# Patient Record
Sex: Female | Born: 1975 | Race: Black or African American | Hispanic: No | Marital: Single | Smoking: Current every day smoker
Health system: Southern US, Community
[De-identification: ages and names within clinical notes are randomized; demographics above are authoritative.]

## PROBLEM LIST (undated history)

## (undated) HISTORY — PX: CHOLECYSTECTOMY: SHX55

---

## 2018-02-03 ENCOUNTER — Encounter: Payer: Self-pay | Admitting: Emergency Medicine

## 2018-02-03 ENCOUNTER — Emergency Department
Admission: EM | Admit: 2018-02-03 | Discharge: 2018-02-03 | Disposition: A | Discharge status: Home / Self Care | Payer: BC Managed Care – PPO | Attending: Emergency Medicine | Admitting: Emergency Medicine

## 2018-02-03 ENCOUNTER — Emergency Department: Payer: BC Managed Care – PPO

## 2018-02-03 DIAGNOSIS — M25532 Pain in left wrist: Secondary | ICD-10-CM | POA: Insufficient documentation

## 2018-02-03 DIAGNOSIS — S4992XA Unspecified injury of left shoulder and upper arm, initial encounter: Secondary | ICD-10-CM | POA: Diagnosis present

## 2018-02-03 DIAGNOSIS — Y92411 Interstate highway as the place of occurrence of the external cause: Secondary | ICD-10-CM | POA: Diagnosis not present

## 2018-02-03 DIAGNOSIS — S46912A Strain of unspecified muscle, fascia and tendon at shoulder and upper arm level, left arm, initial encounter: Secondary | ICD-10-CM | POA: Diagnosis not present

## 2018-02-03 DIAGNOSIS — Z79899 Other long term (current) drug therapy: Secondary | ICD-10-CM | POA: Insufficient documentation

## 2018-02-03 DIAGNOSIS — Y9389 Activity, other specified: Secondary | ICD-10-CM | POA: Diagnosis not present

## 2018-02-03 DIAGNOSIS — M5412 Radiculopathy, cervical region: Secondary | ICD-10-CM | POA: Diagnosis not present

## 2018-02-03 DIAGNOSIS — Y999 Unspecified external cause status: Secondary | ICD-10-CM | POA: Insufficient documentation

## 2018-02-03 MED ORDER — MELOXICAM 15 MG PO TABS: 15.0000 mg | ORAL_TABLET | Freq: Every day | ORAL | 2 refills | Status: AC

## 2018-02-03 MED ORDER — BACLOFEN 10 MG PO TABS: 10.0000 mg | ORAL_TABLET | Freq: Every day | ORAL | 1 refills | Status: AC
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
## 2018-02-03 NOTE — Discharge Instructions (Addendum)
Follow-up with your regular doctor or your orthopedic doctor if you are worsening.  Take medication as prescribed.  Due to you Artie having a nerve impingement if the symptoms in your shoulder and wrist become worse you need to see your doctor.  Return to the emergency department if you are any new problems arise or if you are worsening.  Apply ice to all areas that hurt
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
## 2020-01-14 ENCOUNTER — Encounter: Payer: Self-pay | Admitting: Emergency Medicine

## 2020-01-14 ENCOUNTER — Other Ambulatory Visit: Payer: Self-pay

## 2020-01-14 ENCOUNTER — Emergency Department: Payer: BC Managed Care – PPO

## 2020-01-14 ENCOUNTER — Emergency Department
Admission: EM | Admit: 2020-01-14 | Discharge: 2020-01-14 | Disposition: A | Discharge status: Home / Self Care | Payer: BC Managed Care – PPO | Attending: Emergency Medicine | Admitting: Emergency Medicine

## 2020-01-14 DIAGNOSIS — M5414 Radiculopathy, thoracic region: Secondary | ICD-10-CM | POA: Diagnosis not present

## 2020-01-14 DIAGNOSIS — Z79899 Other long term (current) drug therapy: Secondary | ICD-10-CM | POA: Insufficient documentation

## 2020-01-14 DIAGNOSIS — M5410 Radiculopathy, site unspecified: Secondary | ICD-10-CM

## 2020-01-14 DIAGNOSIS — R109 Unspecified abdominal pain: Secondary | ICD-10-CM

## 2020-01-14 DIAGNOSIS — R1031 Right lower quadrant pain: Secondary | ICD-10-CM | POA: Diagnosis present

## 2020-01-14 DIAGNOSIS — F17200 Nicotine dependence, unspecified, uncomplicated: Secondary | ICD-10-CM | POA: Insufficient documentation

## 2020-01-14 LAB — URINALYSIS, COMPLETE (UACMP) WITH MICROSCOPIC
Bacteria, UA: NONE SEEN
Bilirubin Urine: NEGATIVE
Glucose, UA: NEGATIVE mg/dL
Hgb urine dipstick: NEGATIVE
Ketones, ur: NEGATIVE mg/dL
Leukocytes,Ua: NEGATIVE
Nitrite: NEGATIVE
Protein, ur: NEGATIVE mg/dL
Specific Gravity, Urine: 1.008 (ref 1.005–1.030)
pH: 7 (ref 5.0–8.0)

## 2020-01-14 LAB — CBC WITH DIFFERENTIAL/PLATELET
Abs Immature Granulocytes: 0.04 10*3/uL (ref 0.00–0.07)
Basophils Absolute: 0 10*3/uL (ref 0.0–0.1)
Basophils Relative: 0 %
Eosinophils Absolute: 0.3 10*3/uL (ref 0.0–0.5)
Eosinophils Relative: 4 %
HCT: 38.4 % (ref 36.0–46.0)
Hemoglobin: 11.8 g/dL — ABNORMAL LOW (ref 12.0–15.0)
Immature Granulocytes: 1 %
Lymphocytes Relative: 29 %
Lymphs Abs: 2.3 10*3/uL (ref 0.7–4.0)
MCH: 28.6 pg (ref 26.0–34.0)
MCHC: 30.7 g/dL (ref 30.0–36.0)
MCV: 93.2 fL (ref 80.0–100.0)
Monocytes Absolute: 0.5 10*3/uL (ref 0.1–1.0)
Monocytes Relative: 6 %
Neutro Abs: 4.8 10*3/uL (ref 1.7–7.7)
Neutrophils Relative %: 60 %
Platelets: 339 10*3/uL (ref 150–400)
RBC: 4.12 MIL/uL (ref 3.87–5.11)
RDW: 14 % (ref 11.5–15.5)
WBC: 8 10*3/uL (ref 4.0–10.5)
nRBC: 0 % (ref 0.0–0.2)

## 2020-01-14 LAB — COMPREHENSIVE METABOLIC PANEL
ALT: 15 U/L (ref 0–44)
AST: 17 U/L (ref 15–41)
Albumin: 3.7 g/dL (ref 3.5–5.0)
Alkaline Phosphatase: 58 U/L (ref 38–126)
Anion gap: 7 (ref 5–15)
BUN: 11 mg/dL (ref 6–20)
CO2: 27 mmol/L (ref 22–32)
Calcium: 8.7 mg/dL — ABNORMAL LOW (ref 8.9–10.3)
Chloride: 105 mmol/L (ref 98–111)
Creatinine, Ser: 0.57 mg/dL (ref 0.44–1.00)
GFR calc Af Amer: >60 mL/min (ref >60)
GFR calc non Af Amer: >60 mL/min (ref >60)
Glucose, Bld: 101 mg/dL — ABNORMAL HIGH (ref 70–99)
Potassium: 3.6 mmol/L (ref 3.5–5.1)
Sodium: 139 mmol/L (ref 135–145)
Total Bilirubin: 0.5 mg/dL (ref 0.3–1.2)
Total Protein: 7.6 g/dL (ref 6.5–8.1)

## 2020-01-14 LAB — HCG, QUANTITATIVE, PREGNANCY: hCG, Beta Chain, Quant, S: 1 m[IU]/mL (ref <5)

## 2020-01-14 LAB — LIPASE, BLOOD: Lipase: 22 U/L (ref 11–51)

## 2020-01-14 MED ORDER — FENTANYL CITRATE (PF) 100 MCG/2ML IJ SOLN
50.0000 ug | Freq: Once | INTRAMUSCULAR | Status: AC
  Administered 2020-01-14: 50 ug via INTRAVENOUS
  Filled 2020-01-14: qty 2

## 2020-01-14 MED ORDER — HYDROMORPHONE HCL 1 MG/ML IJ SOLN
1.0000 mg | Freq: Once | INTRAMUSCULAR | Status: AC
  Administered 2020-01-14: 08:00:00 1 mg via INTRAVENOUS
  Filled 2020-01-14: qty 1

## 2020-01-14 MED ORDER — NAPROXEN 375 MG PO TABS: 375.0000 mg | ORAL_TABLET | Freq: Two times a day (BID) | ORAL | 0 refills | Status: AC

## 2020-01-14 MED ORDER — KETOROLAC TROMETHAMINE 30 MG/ML IJ SOLN
15.0000 mg | Freq: Once | INTRAMUSCULAR | Status: AC
  Administered 2020-01-14: 08:00:00 15 mg via INTRAVENOUS
  Filled 2020-01-14: qty 1

## 2020-01-14 MED ORDER — HYDROCODONE-ACETAMINOPHEN 5-325 MG PO TABS: 1.0000 | ORAL_TABLET | ORAL | 0 refills | Status: AC | PRN

## 2020-01-14 MED ORDER — IOHEXOL 350 MG/ML SOLN
100.0000 mL | Freq: Once | INTRAVENOUS | Status: AC | PRN
  Administered 2020-01-14: 100 mL via INTRAVENOUS

## 2020-01-14 MED ORDER — ONDANSETRON HCL 4 MG/2ML IJ SOLN
4.0000 mg | Freq: Once | INTRAMUSCULAR | Status: AC
  Administered 2020-01-14: 4 mg via INTRAVENOUS
  Filled 2020-01-14: qty 2

## 2020-01-14 NOTE — ED Triage Notes (Signed)
Pt to triage via w/c, tearful; pt reports x 4 days having rt flank/side pain with no accomp symptoms, no hx of same

## 2020-01-14 NOTE — ED Notes (Signed)
Pt still c/o pain. Pt tearful when this RN walks in. MD notified.

## 2020-01-14 NOTE — Discharge Instructions (Addendum)
As we discussed, I suspect your symptoms could be due to a herniated disc or muscular strain.  Start taking the naproxen, which is similar to Aleve, twice a day for the next 5 days.  Take this with food.  In addition, I have prescribed a stronger pain medication as needed.  Do not drive while taking the hydrocodone.

## 2020-01-14 NOTE — ED Provider Notes (Signed)
Assumed care from Dr. Don Perking at 7 AM. Briefly, the patient is a 44 y.o. female with PMHx of  has no past medical history on file. here with severe R flank pain radiating towards RLQ. CT stone study is negative. CBC unremarkable with exception of mild anemia. CMP shows normal LFTs, renal function. She is s/p cholecystectomy. Plan to f/u UA.   Labs Reviewed  CBC WITH DIFFERENTIAL/PLATELET - Abnormal; Notable for the following components:      Result Value   Hemoglobin 11.8 (*)    All other components within normal limits  COMPREHENSIVE METABOLIC PANEL - Abnormal; Notable for the following components:   Glucose, Bld 101 (*)    Calcium 8.7 (*)    All other components within normal limits  HCG, QUANTITATIVE, PREGNANCY  LIPASE, BLOOD  URINALYSIS, COMPLETE (UACMP) WITH MICROSCOPIC   Course of Care: -Had a long discussion with patient regarding D-dimer versus CT angio.  Given her symptoms are so severe, she elects for CT which I think is reasonable as it will also show me musculoskeletal etiology and lung parenchyma.  Fortunately, CT negative.  Of note, patient symptoms do seem somewhat positional and she has a history of radicular symptoms in her neck.  I suspect she likely has a component of musculoskeletal chest wall pain and possibly thoracic radiculopathy.  Will treat with NSAIDs and brief course of analgesics.  No apparent emergent pathology.  Return precautions given.  She feels markedly improved in the ED.     Shaune Pollack, MD 01/14/20 [phone]

## 2020-01-14 NOTE — ED Provider Notes (Signed)
[facility] Emergency Department Provider Note  ____________________________________________  Time seen: Approximately 6:10 AM  I have reviewed the triage vital signs and the nursing notes.   HISTORY  Chief Complaint Flank Pain   HPI Sandra Mahoney is a 44 y.o. female with a history of elevated BMI and cholecystectomy who presents for evaluation of right flank pain.  Patient has had  sharp constant severe pain for 4 days.  She did not come before to the emergency room because she had to work.  The pain radiates to her lower abdomen and to her upper back.  She denies ever having similar pain.  No dysuria or hematuria, no fever chills, no chest pain, no diarrhea or constipation, no nausea or vomiting.  She does report that the pain is worse with deep inspiration or when she coughs.  She reports history of chronic cough from smoking.  Denies any personal or family history of blood clots, recent travel immobilization, leg pain, hemoptysis, exogenous hormones.  PMH  Elevated BMI  Past Surgical History:  Procedure Laterality Date  . CHOLECYSTECTOMY      Prior to Admission medications   Medication Sig Start Date End Date Taking? Authorizing Provider  buPROPion (WELLBUTRIN SR) 150 MG 12 hr tablet Take 150 mg by mouth 2 (two) times daily.    [provider]  gabapentin (NEURONTIN) 300 MG capsule Take 300 mg by mouth at bedtime.    [provider]  HYDROcodone-acetaminophen (NORCO/VICODIN) 5-325 MG tablet Take 1-2 tablets by mouth every 4 (four) hours as needed for moderate pain or severe pain. 01/14/20 01/13/21  Duffy Bruce, MD  naproxen (NAPROSYN) 375 MG tablet Take 1 tablet (375 mg total) by mouth 2 (two) times daily with a meal for 5 days. 01/14/20 01/19/20  Duffy Bruce, MD    Allergies Percocet [oxycodone-acetaminophen]  No family history on file.  Social History Social History   Tobacco Use  . Smoking status: Current Every Day  Smoker  . Smokeless tobacco: Never Used  Substance Use Topics  . Alcohol use: Not on file  . Drug use: Not on file    Review of Systems  Constitutional: Negative for fever. Eyes: Negative for visual changes. ENT: Negative for sore throat. Neck: No neck pain  Cardiovascular: Negative for chest pain. Respiratory: Negative for shortness of breath. Gastrointestinal: Negative for abdominal pain, vomiting or diarrhea. Genitourinary: Negative for dysuria. + R flank pain Musculoskeletal: Negative for back pain. Skin: Negative for rash. Neurological: Negative for headaches, weakness or numbness. Psych: No SI or HI  ____________________________________________   PHYSICAL EXAM:  VITAL SIGNS: ED Triage Vitals  Enc Vitals Group     BP 01/14/20 0549 131/76     Pulse Rate 01/14/20 0549 93     Resp 01/14/20 0549 20     Temp 01/14/20 0549 97.9 F (36.6 C)     Temp Source 01/14/20 0549 Oral     SpO2 01/14/20 0549 97 %     Weight 01/14/20 0548 (!) 350 lb (158.8 kg)     Height 01/14/20 0548 5\' 5"  (1.651 m)     Head Circumference --      Peak Flow --      Pain Score 01/14/20 0547 10     Pain Loc --      Pain Edu? --      Excl. in [place]? --     Constitutional: Alert and oriented. Well appearing and in no apparent distress. HEENT:  Head: Normocephalic and atraumatic.         Eyes: Conjunctivae are normal. Sclera is non-icteric.       Mouth/Throat: Mucous membranes are moist.       Neck: Supple with no signs of meningismus. Cardiovascular: Regular rate and rhythm. No murmurs, gallops, or rubs. 2+ symmetrical distal pulses are present in all extremities. No JVD. Respiratory: Normal respiratory effort. Lungs are clear to auscultation bilaterally. No wheezes, crackles, or rhonchi.  Gastrointestinal: Limited exam due to body habitus, non tender, and non distended with positive bowel sounds. No rebound or guarding Genitourinary: Mild R CVA tenderness. Musculoskeletal: Nontender with  normal range of motion in all extremities. No edema, cyanosis, or erythema of extremities. Neurologic: Normal speech and language. Face is symmetric. Moving all extremities. No gross focal neurologic deficits are appreciated. Skin: Skin is warm, dry and intact. No rash noted. Psychiatric: Mood and affect are normal. Speech and behavior are normal.  ____________________________________________   LABS (all labs ordered are listed, but only abnormal results are displayed)  Labs Reviewed  CBC WITH DIFFERENTIAL/PLATELET - Abnormal; Notable for the following components:      Result Value   Hemoglobin 11.8 (*)    All other components within normal limits  COMPREHENSIVE METABOLIC PANEL - Abnormal; Notable for the following components:   Glucose, Bld 101 (*)    Calcium 8.7 (*)    All other components within normal limits  URINALYSIS, COMPLETE (UACMP) WITH MICROSCOPIC - Abnormal; Notable for the following components:   Color, Urine YELLOW (*)    APPearance CLEAR (*)    All other components within normal limits  HCG, QUANTITATIVE, PREGNANCY  LIPASE, BLOOD   ____________________________________________  EKG  none ____________________________________________  RADIOLOGY  CT: pending  ____________________________________________   PROCEDURES  Procedure(s) performed: None Procedures Critical Care performed:  None ____________________________________________   INITIAL IMPRESSION / ASSESSMENT AND PLAN / ED COURSE  44 y.o. female with a history of elevated BMI and cholecystectomy who presents for evaluation of right flank pain x 4 days.  Patient looks uncomfortable and teary-eyed but in no significant distress, she has mild CVA tenderness, abdominal exam is limited by body habitus but no reproducible tenderness.  No midline CT no spine tenderness.  Vital signs are within normal limits.  Old medical records reviewed.  Differential diagnoses including kidney stone versus  pyelonephritis versus shingles although no rashes seen at this time, versus diverticulitis, versus pneumonia although no fever or cough.  Less likely PE with no tachypnea, tachycardia, hypoxia and PERC negative.  Will give IV fentanyl and zofran for pain control. Get CT renal, labs, UA.  _________________________ 7:11 AM on 01/14/2020 ----------------------------------------- Imaging, labs, and UA pending. Care transferred to Dr. Erma Heritage       _____________________________________________ Please note:  Patient was evaluated in Emergency Department today for the symptoms described in the history of present illness. Patient was evaluated in the context of the global COVID-19 pandemic, which necessitated consideration that the patient might be at risk for infection with the SARS-CoV-2 virus that causes COVID-19. Institutional protocols and algorithms that pertain to the evaluation of patients at risk for COVID-19 are in a state of rapid change based on information released by regulatory bodies including the CDC and federal and state organizations. These policies and algorithms were followed during the patient's care in the ED.  Some ED evaluations and interventions may be delayed as a result of limited staffing during the pandemic.   [place] Controlled Substance Database was reviewed by  me. ____________________________________________   FINAL CLINICAL IMPRESSION(S) / ED DIAGNOSES   Final diagnoses:  Right flank pain  Radicular pain      NEW MEDICATIONS STARTED DURING THIS VISIT:  ED Discharge Orders         Ordered    naproxen (NAPROSYN) 375 MG tablet  2 times daily with meals     01/14/20 0937    HYDROcodone-acetaminophen (NORCO/VICODIN) 5-325 MG tablet  Every 4 hours PRN     01/14/20 0086           Note:  This document was prepared using Dragon voice recognition software and may include unintentional dictation errors.    Don Perking, [place], MD 01/15/20 [phone]

## 2021-08-16 IMAGING — CT CT ANGIO CHEST
2 of 6 series · 18 of 46 positions shown · IV contrast (omnipaque)
Comparison: None.

CLINICAL DATA: Right flank pain for 4 days.

EXAM:
CT ANGIOGRAPHY CHEST WITH CONTRAST
TECHNIQUE: Multidetector CT imaging of the chest was performed using the
standard protocol during bolus administration of intravenous
contrast. Multiplanar CT image reconstructions and MIPs were
obtained to evaluate the vascular anatomy.
CONTRAST:  100 mL OMNIPAQUE IOHEXOL 350 MG/ML SOLN

[Series 5: thins · axial · 0.66mm/px · z∈[-564,-312]mm · 15 of 278 slices shown]
[im 13/278  lung]
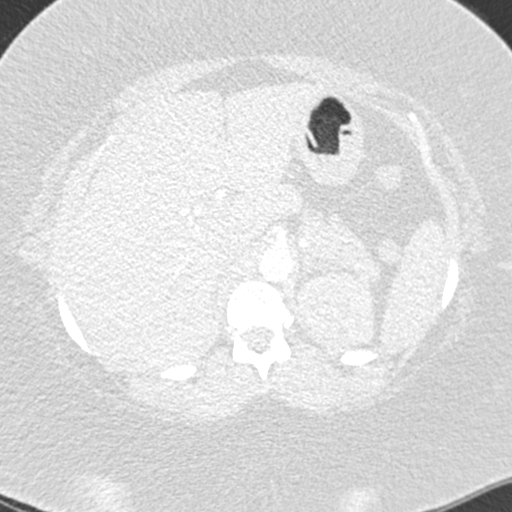
[im 37/278  soft-tissue]
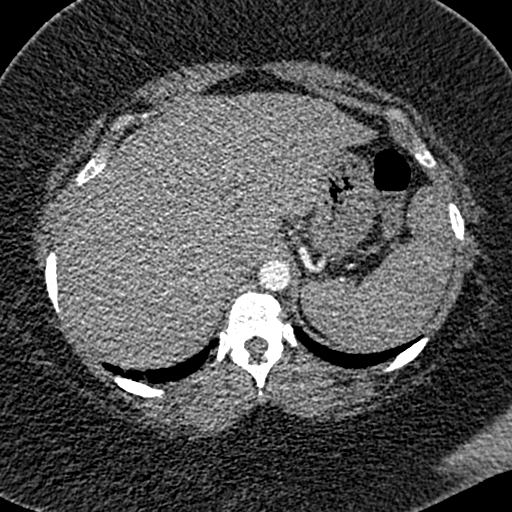
[im 49/278  lung]
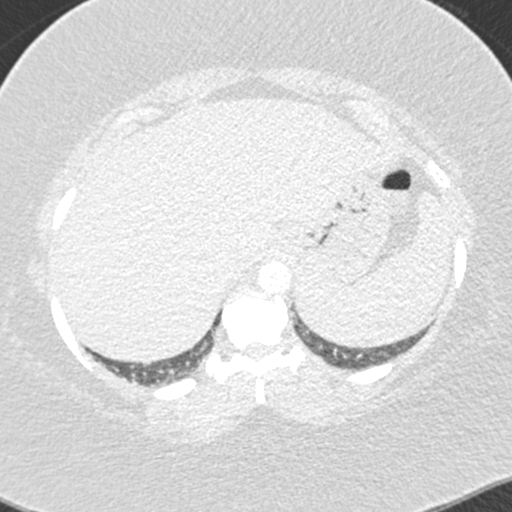
[im 73/278  soft-tissue]
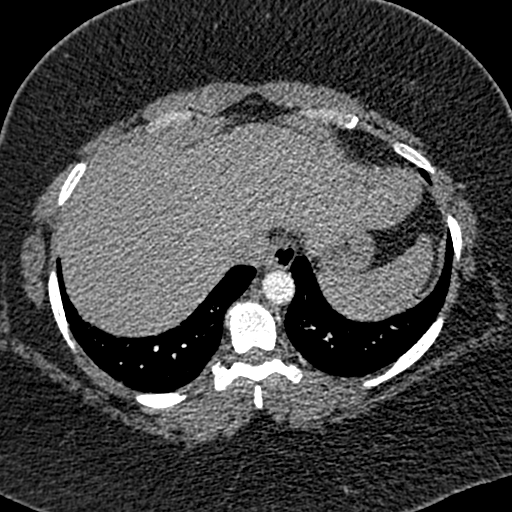
[im 85/278  lung]
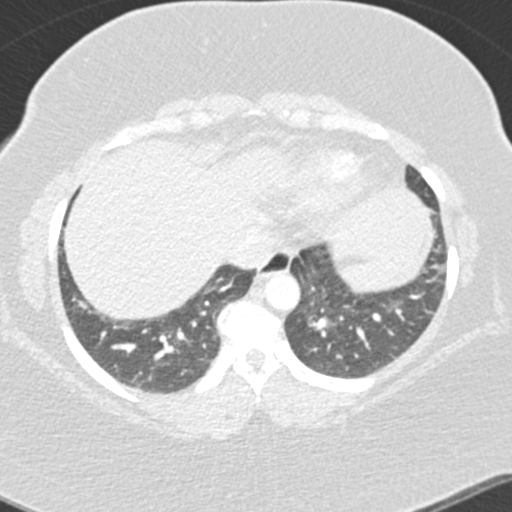
[im 109/278  soft-tissue]
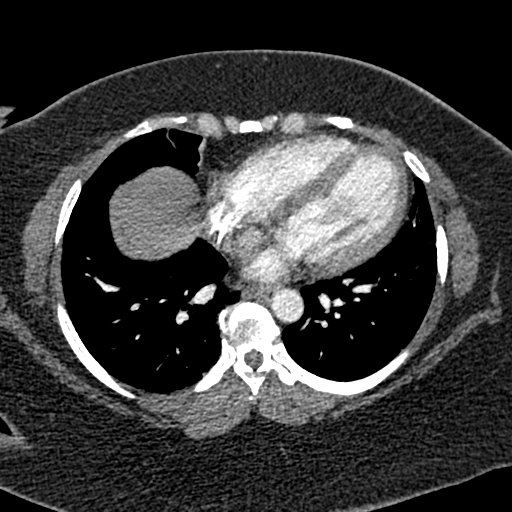
[im 121/278  lung]
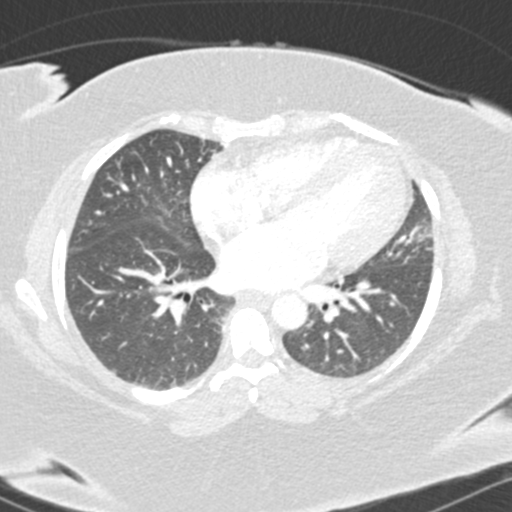
[im 145/278  soft-tissue]
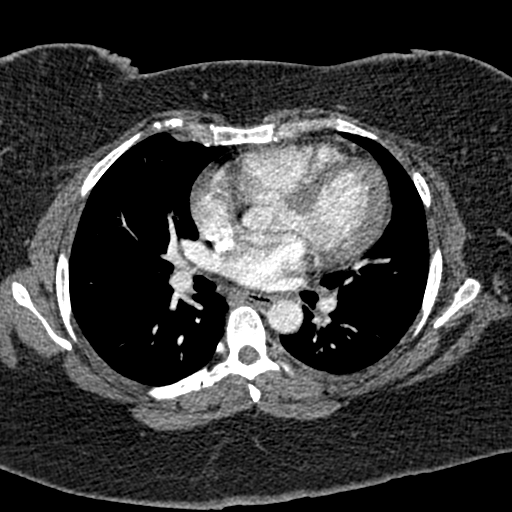
[im 157/278  lung]
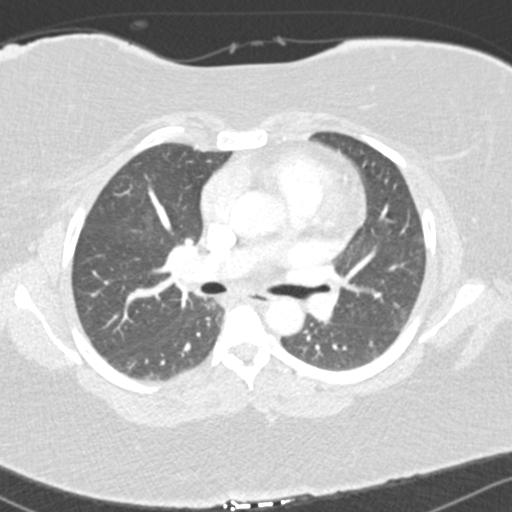
[im 169/278  soft-tissue]
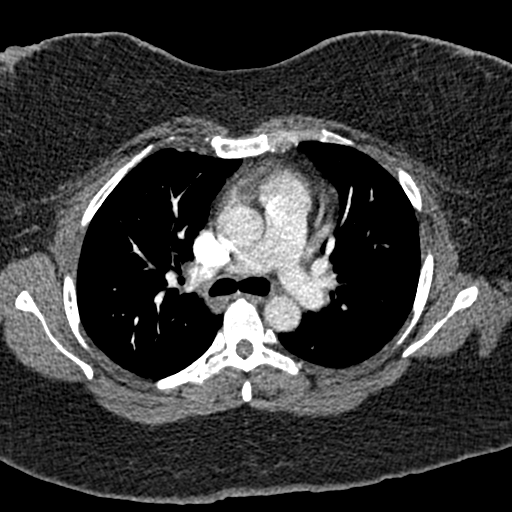
[im 193/278  lung]
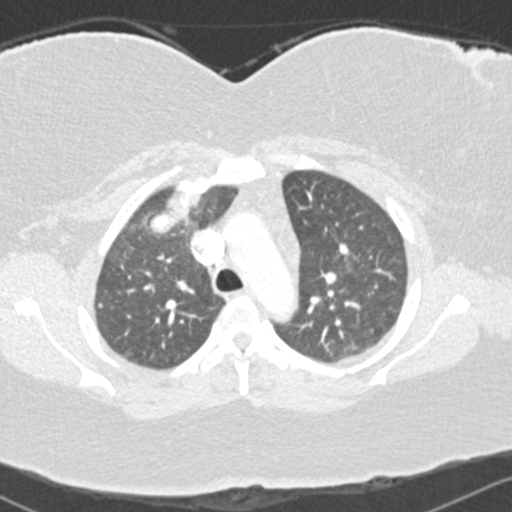
[im 205/278  soft-tissue]
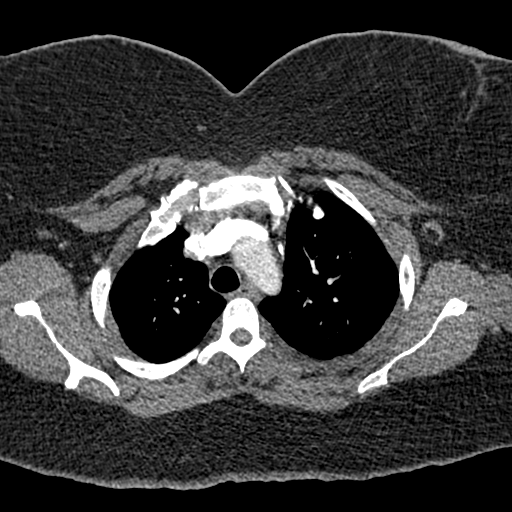
[im 229/278  lung]
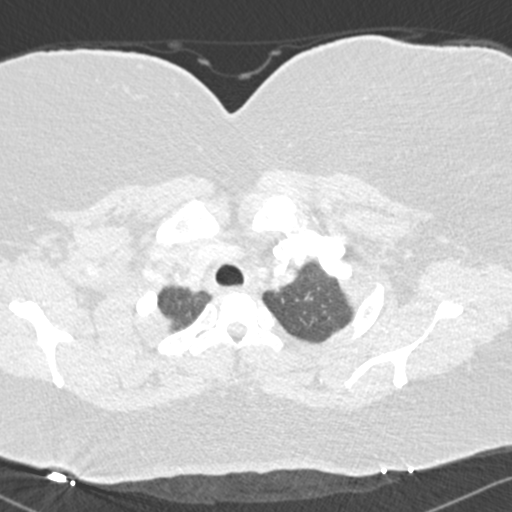
[im 241/278  soft-tissue]
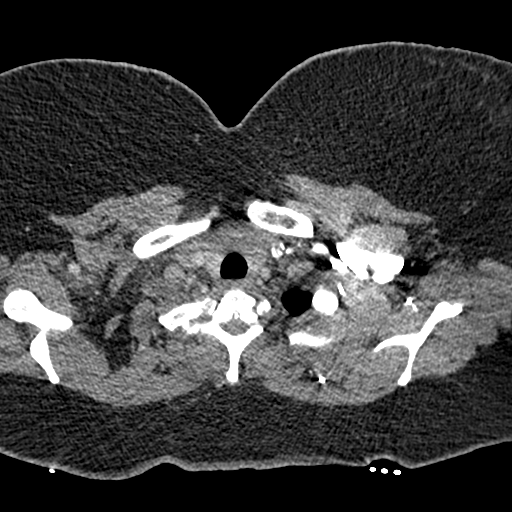
[im 265/278  lung]
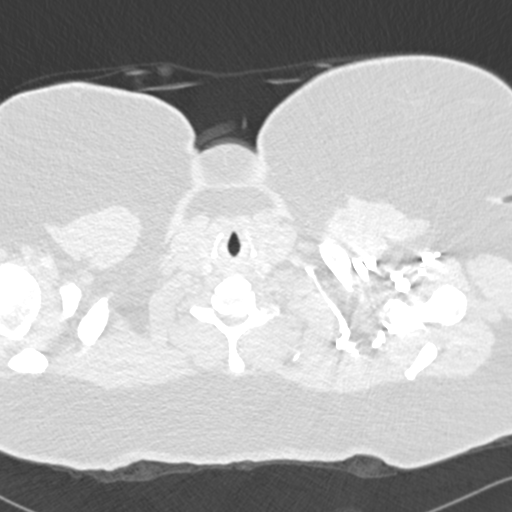

[Series 7: coronal mpr · coronal · 0.57mm/px · 3 of 103 slices shown]
[im 26/103  soft-tissue]
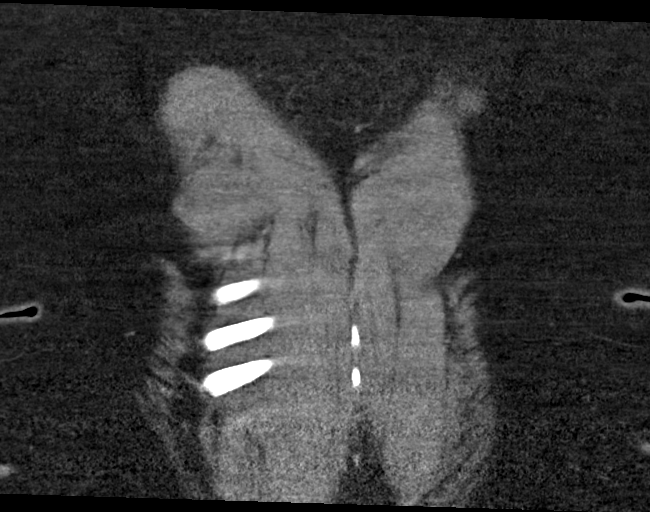
[im 52/103  soft-tissue]
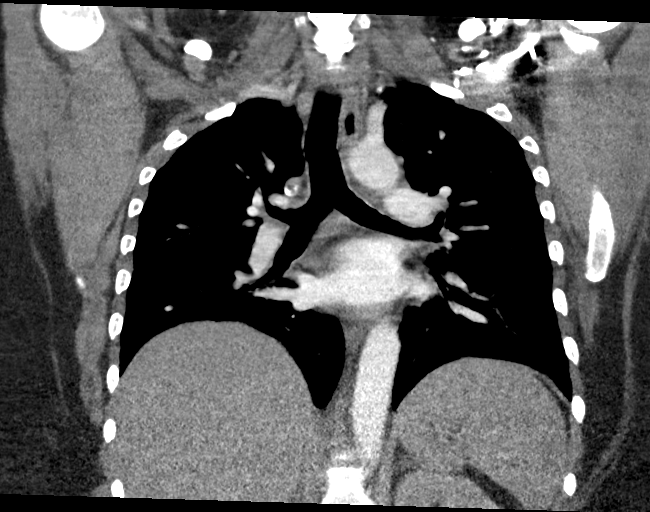
[im 77/103  soft-tissue]
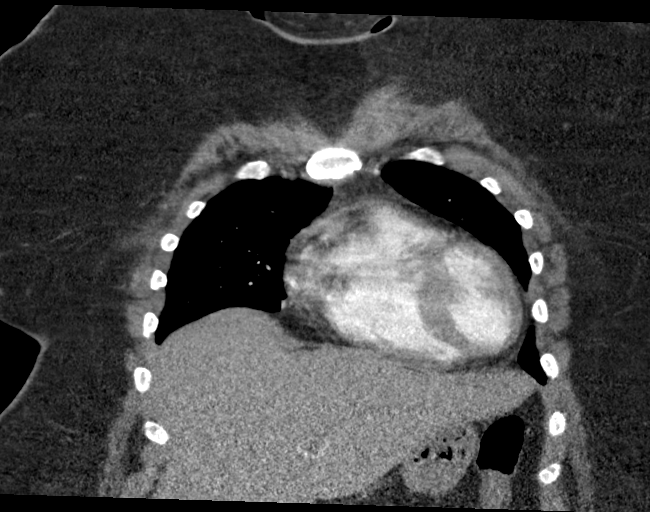

[18 of 46 positions shown; findings below may reference images not displayed]

FINDINGS: Cardiovascular: Satisfactory opacification of the pulmonary arteries
to the segmental level. No evidence of pulmonary embolism. Mild
cardiomegaly. No pericardial effusion.

Mediastinum/Nodes: No enlarged mediastinal, hilar, or axillary lymph
nodes. Thyroid gland, trachea, and esophagus demonstrate no
significant findings.

Lungs/Pleura: Lungs are clear. No pleural effusion or pneumothorax.

Upper Abdomen: No acute finding.  Status post cholecystectomy.

Musculoskeletal: No acute or focal abnormality. Mild thoracic
spondylosis noted.

Review of the MIP images confirms the above findings.
IMPRESSION: Negative for pulmonary embolus.  No acute disease.

Cardiomegaly.

## 2021-08-16 IMAGING — CT CT RENAL STONE PROTOCOL
2 of 4 series · 16 of 46 positions shown, 18 images · non-contrast
Comparison: None.

CLINICAL DATA: Four-day history of right flank pain.

EXAM:
CT ABDOMEN AND PELVIS WITHOUT CONTRAST
TECHNIQUE: Multidetector CT imaging of the abdomen and pelvis was performed
following the standard protocol without IV contrast.

[Series 2: stone full standard · axial · 0.72mm/px · z∈[-940,-464]mm · 13 of 105 slices shown, 15 images]
[im 5/105  soft-tissue]
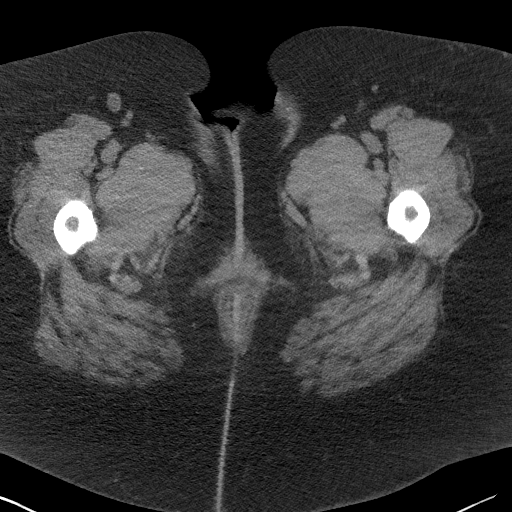
[im 5/105  bone]
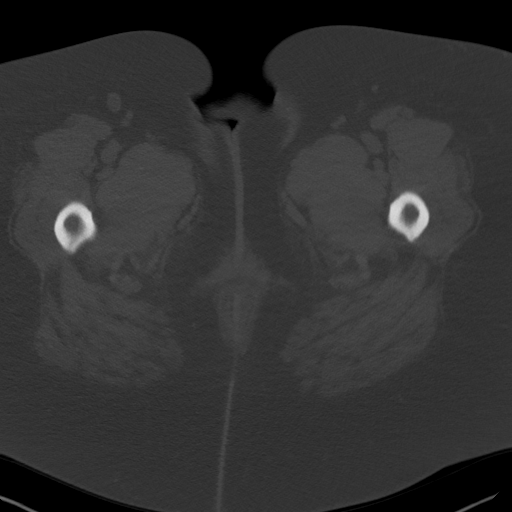
[im 14/105  soft-tissue]
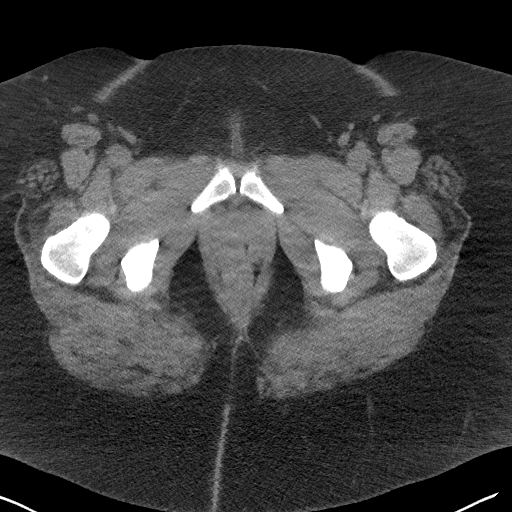
[im 22/105  soft-tissue]
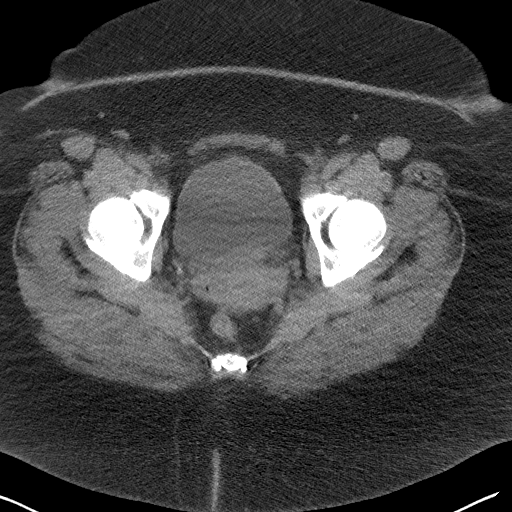
[im 31/105  soft-tissue]
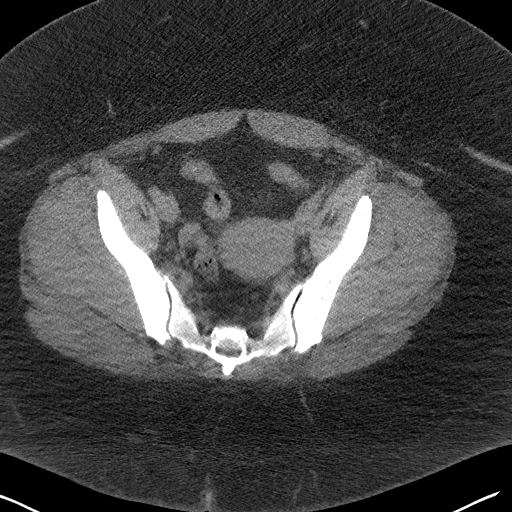
[im 35/105  soft-tissue]
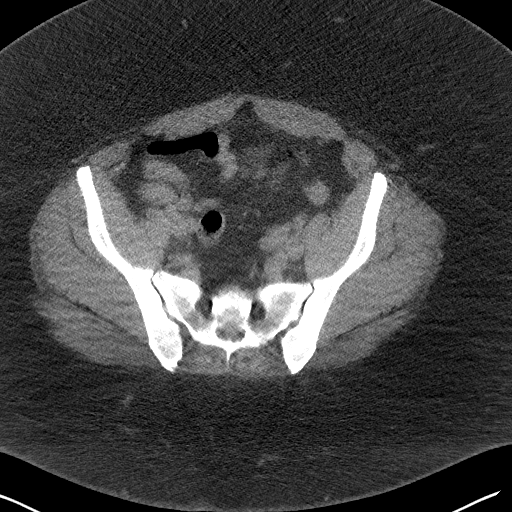
[im 44/105  soft-tissue]
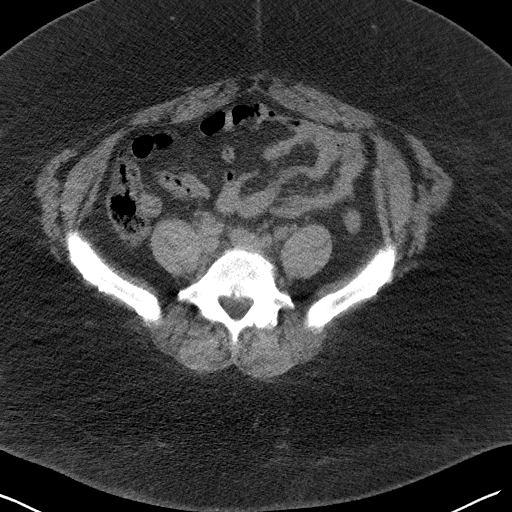
[im 53/105  soft-tissue]
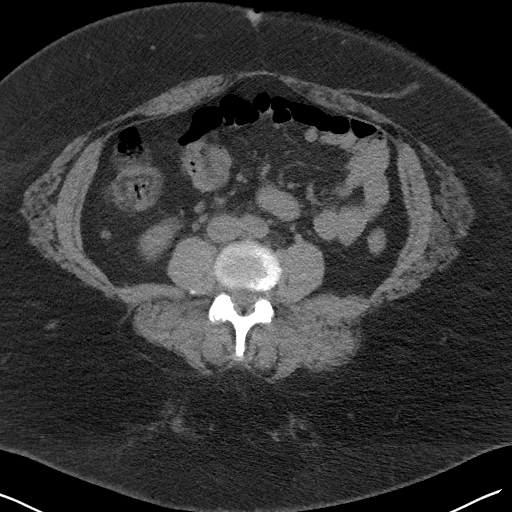
[im 61/105  soft-tissue]
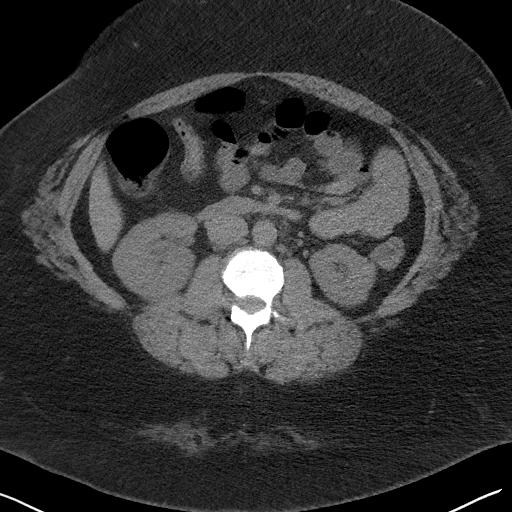
[im 70/105  soft-tissue]
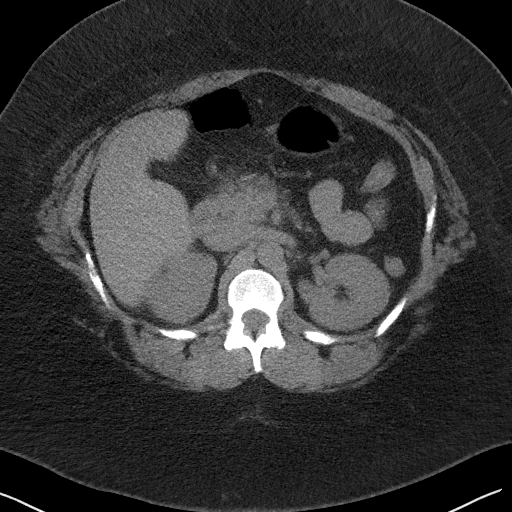
[im 70/105  bone]
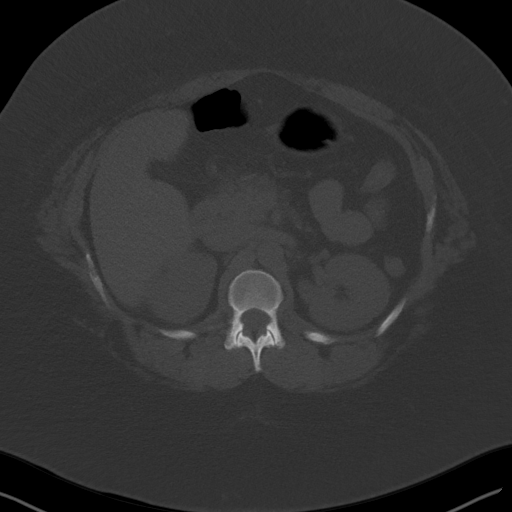
[im 74/105  soft-tissue]
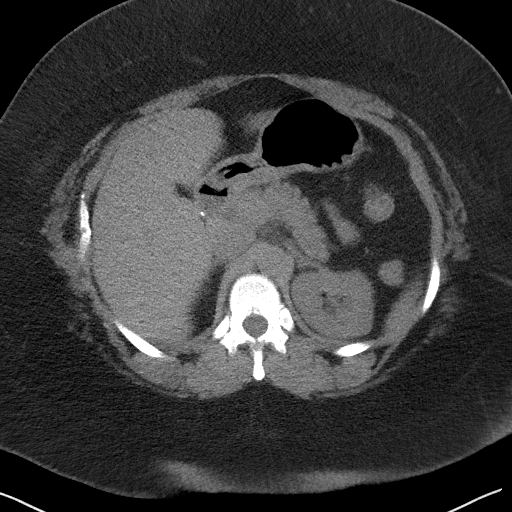
[im 83/105  soft-tissue]
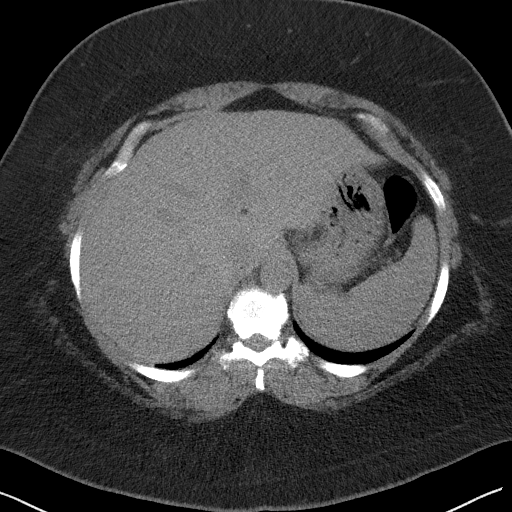
[im 92/105  soft-tissue]
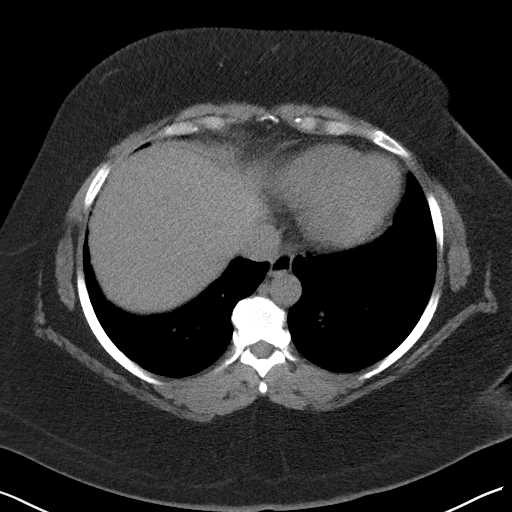
[im 100/105  soft-tissue]
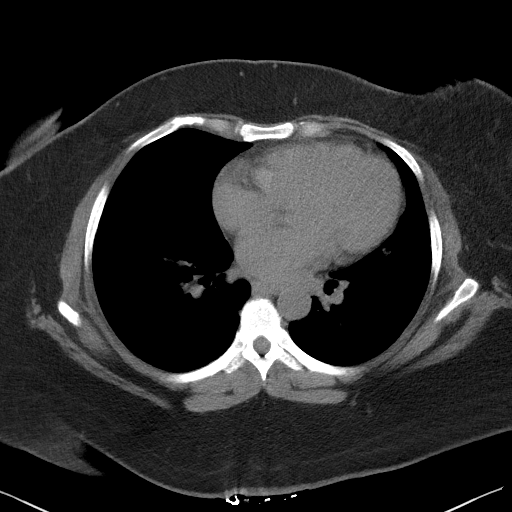

[Series 5: coronal · coronal · 0.83mm/px · 3 of 200 slices shown]
[im 67/200  soft-tissue]
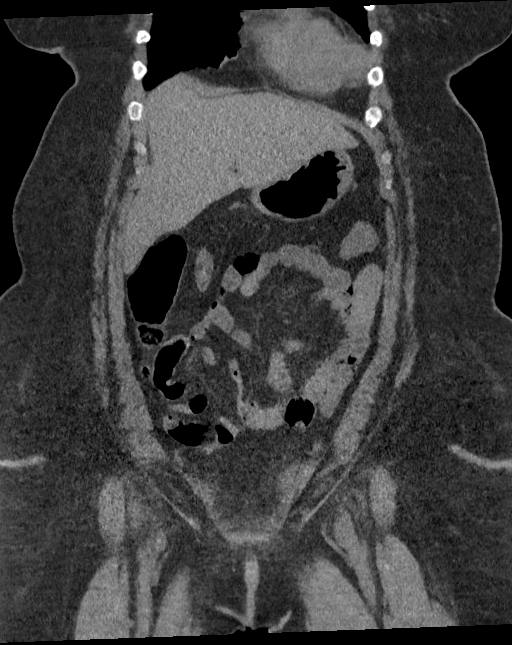
[im 89/200  soft-tissue]
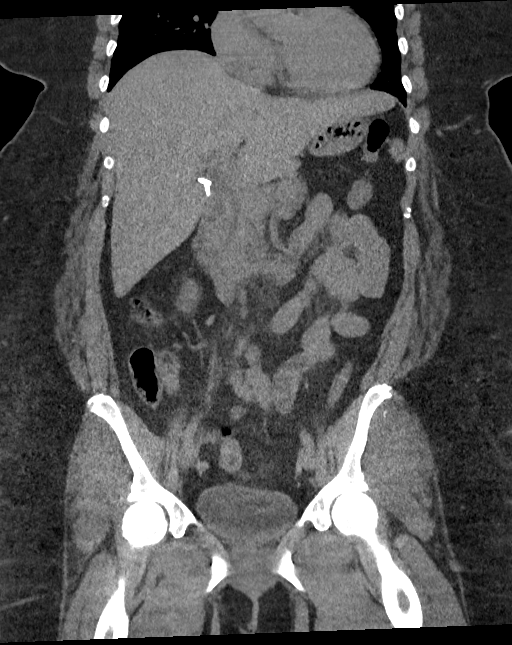
[im 111/200  soft-tissue]
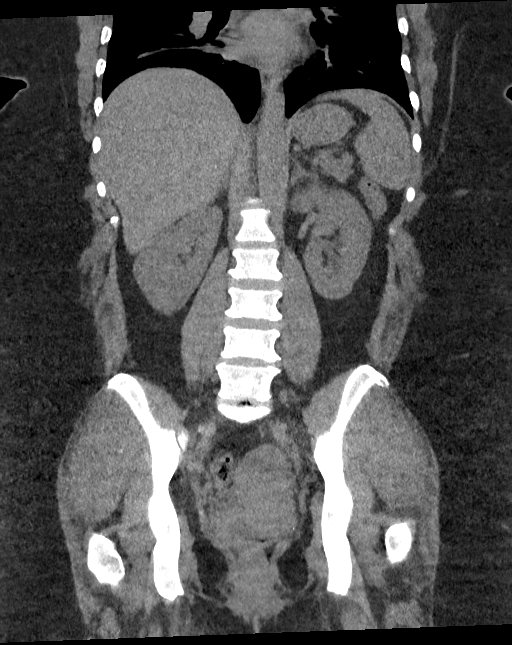

[16 of 46 positions shown; findings below may reference images not displayed]

FINDINGS: Lower chest: The lung bases are clear of acute process. No pleural
effusion or pulmonary lesions. The heart is normal in size. No
pericardial effusion. The distal esophagus and aorta are
unremarkable.

Hepatobiliary: No hepatic lesions are identified without contrast.
The gallbladder is surgically absent. No common bile duct
dilatation.

Pancreas: No mass, inflammation or ductal dilatation.

Spleen: Normal size. No focal lesions.

Adrenals/Urinary Tract: The adrenal glands and kidneys are
unremarkable. No renal calculi.

Very difficult to follow the right ureter in the pelvis because of
the patient's body habitus there is significant artifact. There is a
small calcification deep in the right pelvis but I do not think it
is in the ureter. I do not see any definite hydroureteronephrosis.

The bladder is grossly normal.

Stomach/Bowel: The stomach, duodenum, small bowel and colon are
grossly normal without oral contrast. No inflammatory changes, mass
lesions or obstructive findings. The appendix is normal.

Vascular/Lymphatic: The aorta is normal in caliber. No
atheroscerlotic calcifications. No mesenteric of retroperitoneal
mass or adenopathy. Small scattered lymph nodes are noted.

Reproductive: The uterus and ovaries are unremarkable.

Other: No pelvic mass or adenopathy. No free pelvic fluid
collections. No inguinal mass or adenopathy. No abdominal wall
hernia or subcutaneous lesions.

Musculoskeletal: No significant bony findings. Age advanced lower
lumbar facet disease noted along with mild bilateral SI joint and
hip joint degenerative changes.
IMPRESSION: 1. Limited examination due to body habitus. No definite renal,
ureteral or bladder calculi or mass.
2. No acute abdominal/pelvic findings, mass lesions or adenopathy.
3. Status post cholecystectomy. No biliary dilatation.

## 2022-08-06 ENCOUNTER — Ambulatory Visit
Admission: EM | Admit: 2022-08-06 | Discharge: 2022-08-06 | Disposition: A | Discharge status: Home / Self Care | Payer: BC Managed Care – PPO | Attending: Family Medicine | Admitting: Family Medicine

## 2022-08-06 ENCOUNTER — Encounter: Payer: Self-pay | Admitting: Emergency Medicine

## 2022-08-06 ENCOUNTER — Ambulatory Visit (INDEPENDENT_AMBULATORY_CARE_PROVIDER_SITE_OTHER): Payer: BC Managed Care – PPO

## 2022-08-06 DIAGNOSIS — R059 Cough, unspecified: Secondary | ICD-10-CM | POA: Diagnosis not present

## 2022-08-06 DIAGNOSIS — J209 Acute bronchitis, unspecified: Secondary | ICD-10-CM | POA: Diagnosis present

## 2022-08-06 DIAGNOSIS — Z1152 Encounter for screening for COVID-19: Secondary | ICD-10-CM | POA: Insufficient documentation

## 2022-08-06 DIAGNOSIS — R0602 Shortness of breath: Secondary | ICD-10-CM | POA: Insufficient documentation

## 2022-08-06 DIAGNOSIS — Z720 Tobacco use: Secondary | ICD-10-CM | POA: Insufficient documentation

## 2022-08-06 DIAGNOSIS — R051 Acute cough: Secondary | ICD-10-CM | POA: Insufficient documentation

## 2022-08-06 DIAGNOSIS — R062 Wheezing: Secondary | ICD-10-CM

## 2022-08-06 LAB — RESP PANEL BY RT-PCR (FLU A&B, COVID) ARPGX2
Influenza A by PCR: NEGATIVE
Influenza B by PCR: NEGATIVE
SARS Coronavirus 2 by RT PCR: NEGATIVE

## 2022-08-06 MED ORDER — ALBUTEROL SULFATE HFA 108 (90 BASE) MCG/ACT IN AERS: 1.0000 | INHALATION_SPRAY | Freq: Four times a day (QID) | RESPIRATORY_TRACT | 0 refills | Status: DC | PRN

## 2022-08-06 MED ORDER — IPRATROPIUM-ALBUTEROL 0.5-2.5 (3) MG/3ML IN SOLN
3.0000 mL | Freq: Once | RESPIRATORY_TRACT | Status: AC
  Administered 2022-08-06: 3 mL via RESPIRATORY_TRACT

## 2022-08-06 MED ORDER — PROMETHAZINE-DM 6.25-15 MG/5ML PO SYRP: 5.0000 mL | ORAL_SOLUTION | Freq: Four times a day (QID) | ORAL | 0 refills | Status: DC | PRN

## 2022-08-06 MED ORDER — PREDNISONE 20 MG PO TABS: 40.0000 mg | ORAL_TABLET | Freq: Every day | ORAL | 0 refills | Status: AC

## 2022-08-06 NOTE — Discharge Instructions (Addendum)
-  You are negative for flu, COVID and pneumonia.  You have bronchitis which is viral. - I am have sent an inhaler, corticosteroids and a cough medicine.  Plenty rest and fluids. - Return if you develop a fever or increased shortness of breath.  Bronchitis can last for a couple weeks sometimes.

## 2022-08-06 NOTE — ED Triage Notes (Signed)
Patient c/o cough and chest congestion since Wed.  Patient also reports SOB.  Patient denies fevers.

## 2022-08-06 NOTE — ED Provider Notes (Signed)
[facility]    CSN: [id number] Arrival date & time: 08/06/22  0906      History   Chief Complaint Chief Complaint  Patient presents with   Cough    HPI Sandra Mahoney is a 46 y.o. female presenting for 4-day history of cough, chest congestion, wheezing and shortness of breath.  Reports shortness of breath when she is up and moving around.  Improves when she is resting.  Denies fever.  Has been a little fatigued.  No report of any nasal congestion or sore throat.  Denies chest pain, abdominal pain, vomiting or diarrhea.  Patient is an everyday smoker.  She denies history of asthma, COPD.  No history of heart problems.  She says she has taken all the over-the-counter cough medications and they have not helped with her symptoms.  She reports having bronchitis in the past and symptoms might feel similar but she says has been a long time since she had bronchitis.  No known COVID or flu exposure.  No other complaints.  HPI  History reviewed. No pertinent past medical history.  There are no problems to display for this patient.   Past Surgical History:  Procedure Laterality Date   CHOLECYSTECTOMY      OB History   No obstetric history on file.      Home Medications    Prior to Admission medications   Medication Sig Start Date End Date Taking? Authorizing Provider  albuterol (VENTOLIN HFA) 108 (90 Base) MCG/ACT inhaler Inhale 1-2 puffs into the lungs every 6 (six) hours as needed for wheezing or shortness of breath. 08/06/22  Yes Shirlee Latch, PA-C  predniSONE (DELTASONE) 20 MG tablet Take 2 tablets (40 mg total) by mouth daily for 5 days. 08/06/22 08/11/22 Yes Shirlee Latch, PA-C  promethazine-dextromethorphan (PROMETHAZINE-DM) 6.25-15 MG/5ML syrup Take 5 mLs by mouth 4 (four) times daily as needed. 08/06/22  Yes Shirlee Latch, PA-C  buPROPion (WELLBUTRIN SR) 150 MG 12 hr tablet Take 150 mg by mouth 2 (two) times daily.    [provider]  gabapentin  (NEURONTIN) 300 MG capsule Take 300 mg by mouth at bedtime.    [provider]    Family History History reviewed. No pertinent family history.  Social History Social History   Tobacco Use   Smoking status: Every Day    Types: Cigarettes   Smokeless tobacco: Never  Vaping Use   Vaping Use: Never used  Substance Use Topics   Alcohol use: Yes   Drug use: Never     Allergies   Percocet [oxycodone-acetaminophen]   Review of Systems Review of Systems  Constitutional:  Positive for fatigue. Negative for chills, diaphoresis and fever.  HENT:  Positive for congestion. Negative for ear pain, rhinorrhea, sinus pressure, sinus pain and sore throat.   Respiratory:  Positive for cough, shortness of breath and wheezing. Negative for chest tightness.   Cardiovascular:  Negative for chest pain and leg swelling.  Gastrointestinal:  Negative for abdominal pain, nausea and vomiting.  Musculoskeletal:  Negative for arthralgias and myalgias.  Skin:  Negative for rash.  Neurological:  Negative for weakness and headaches.  Hematological:  Negative for adenopathy.     Physical Exam Triage Vital Signs ED Triage Vitals  Enc Vitals Group     BP      Pulse      Resp      Temp      Temp src      SpO2  Weight      Height      Head Circumference      Peak Flow      Pain Score      Pain Loc      Pain Edu?      Excl. in GC?    No data found.  Updated Vital Signs BP (!) 113/92 (BP Location: Left Arm)   Pulse 79   Temp 98.2 F (36.8 C) (Oral)   Resp 15   Ht 5\' 5"  (1.651 m)   Wt (!) 343 lb (155.6 kg)   LMP 07/16/2022 (Approximate)   SpO2 96%   BMI 57.08 kg/m   Physical Exam Vitals and nursing note reviewed.  Constitutional:      General: She is not in acute distress.    Appearance: Normal appearance. She is obese. She is not ill-appearing or toxic-appearing.  HENT:     Head: Normocephalic and atraumatic.     Nose: Nose normal.     Mouth/Throat:     Mouth:  Mucous membranes are moist.     Pharynx: Oropharynx is clear.  Eyes:     General: No scleral icterus.       Right eye: No discharge.        Left eye: No discharge.     Conjunctiva/sclera: Conjunctivae normal.  Cardiovascular:     Rate and Rhythm: Normal rate and regular rhythm.     Heart sounds: Normal heart sounds.  Pulmonary:     Effort: Pulmonary effort is normal. No respiratory distress.     Breath sounds: Wheezing (diffuse wheezing throughout) present.  Musculoskeletal:     Cervical back: Neck supple.     Right lower leg: No edema.     Left lower leg: No edema.  Skin:    General: Skin is dry.  Neurological:     General: No focal deficit present.     Mental Status: She is alert. Mental status is at baseline.     Motor: No weakness.     Gait: Gait normal.  Psychiatric:        Mood and Affect: Mood normal.        Behavior: Behavior normal.        Thought Content: Thought content normal.      UC Treatments / Results  Labs (all labs ordered are listed, but only abnormal results are displayed) Labs Reviewed  RESP PANEL BY RT-PCR (FLU A&B, COVID) [id number]    EKG   Radiology DG Chest 2 View  Result Date: 08/06/2022 CLINICAL DATA:  Wheezing, cough and shortness of breath. EXAM: CHEST - 2 VIEW COMPARISON:  CT 01/14/2020. FINDINGS: Heart size appears within normal limits. There is no pleural effusion or edema identified. Diffuse central airway thickening is identified bilaterally. No airspace opacities. Visualized osseous structures appear intact. IMPRESSION: Diffuse central airway thickening compatible with bronchitis or reactive airways disease. Electronically Signed   By: 01/16/2020 M.D.   On: 08/06/2022 10:10    Procedures Procedures (including critical care time)  Medications Ordered in UC Medications  ipratropium-albuterol (DUONEB) 0.5-2.5 (3) MG/3ML nebulizer solution 3 mL (3 mLs Nebulization Given 08/06/22 1020)    Initial Impression / Assessment and Plan /  UC Course  I have reviewed the triage vital signs and the nursing notes.  Pertinent labs & imaging results that were available during my care of the patient were reviewed by me and considered in my medical decision making (see chart for details).   46 year old female  smoker presents for 4-day history of cough, chest congestion, wheezing and shortness of breath.  No fever or chest pain.  Vitals are stable.  She is overall well-appearing.  She does have diffuse wheezing throughout lung fields.  No respiratory distress.  Respiratory panel performed and all negative.  Chest x-ray ordered to assess for possible underlying pneumonia, pulmonary edema.  X-ray does not show any evidence of pneumonia or pulmonary edema.  Evidence for acute bronchitis/reactive airway disease.  Discussed results with patient.  Suspect viral bronchitis.  Patient given DuoNeb treatment in clinic for shortness of breath.  We will treat patient with prednisone, albuterol inhaler and Promethazine DM.  Reviewed plenty of rest and fluids.  Reviewed return and ER precautions.   Final Clinical Impressions(s) / UC Diagnoses   Final diagnoses:  Acute bronchitis, unspecified organism  Acute cough  Shortness of breath  Tobacco abuse     Discharge Instructions      -You are negative for flu, COVID and pneumonia.  You have bronchitis which is viral. - I am have sent an inhaler, corticosteroids and a cough medicine.  Plenty rest and fluids. - Return if you develop a fever or increased shortness of breath.  Bronchitis can last for a couple weeks sometimes.     ED Prescriptions     Medication Sig Dispense Auth. Provider   albuterol (VENTOLIN HFA) 108 (90 Base) MCG/ACT inhaler Inhale 1-2 puffs into the lungs every 6 (six) hours as needed for wheezing or shortness of breath. 1 g Shirlee Latch, PA-C   promethazine-dextromethorphan (PROMETHAZINE-DM) 6.25-15 MG/5ML syrup Take 5 mLs by mouth 4 (four) times daily as needed. 118  mL Eusebio Friendly B, PA-C   predniSONE (DELTASONE) 20 MG tablet Take 2 tablets (40 mg total) by mouth daily for 5 days. 10 tablet Gareth Morgan      PDMP not reviewed this encounter.   Shirlee Latch, PA-C 08/06/22 1029

## 2023-07-21 ENCOUNTER — Encounter: Payer: Self-pay | Admitting: Emergency Medicine

## 2023-07-21 ENCOUNTER — Ambulatory Visit
Admission: EM | Admit: 2023-07-21 | Discharge: 2023-07-21 | Disposition: A | Discharge status: Home / Self Care | Payer: BC Managed Care – PPO | Attending: Family Medicine | Admitting: Family Medicine

## 2023-07-21 DIAGNOSIS — J4521 Mild intermittent asthma with (acute) exacerbation: Secondary | ICD-10-CM

## 2023-07-21 DIAGNOSIS — J019 Acute sinusitis, unspecified: Secondary | ICD-10-CM | POA: Diagnosis not present

## 2023-07-21 MED ORDER — DOXYCYCLINE HYCLATE 100 MG PO CAPS: 100.0000 mg | ORAL_CAPSULE | Freq: Two times a day (BID) | ORAL | 0 refills | Status: AC

## 2023-07-21 MED ORDER — BENZONATATE 100 MG PO CAPS: 100.0000 mg | ORAL_CAPSULE | Freq: Three times a day (TID) | ORAL | 0 refills | Status: AC | PRN

## 2023-07-21 MED ORDER — ALBUTEROL SULFATE HFA 108 (90 BASE) MCG/ACT IN AERS: 2.0000 | INHALATION_SPRAY | RESPIRATORY_TRACT | 0 refills | Status: AC | PRN

## 2023-07-21 MED ORDER — PREDNISONE 20 MG PO TABS: 40.0000 mg | ORAL_TABLET | Freq: Every day | ORAL | 0 refills | Status: AC

## 2023-07-21 NOTE — ED Triage Notes (Signed)
Patient c/o cough and chest congestion for a week.  Patient unsure of fevers.

## 2023-07-21 NOTE — Discharge Instructions (Signed)
Albuterol inhaler--do 2 puffs every 4 hours as needed for shortness of breath or wheezing  Take prednisone 20 mg--2 daily for 5 days; this is for inflammation in your lungs  Take doxycycline 100 mg --1 capsule 2 times daily for 7 days; this is the antibiotic  Take benzonatate 100 mg, 1 tab every 8 hours as needed for cough.

## 2023-07-21 NOTE — ED Provider Notes (Signed)
[facility]    CSN: [id number] Arrival date & time: 07/21/23  1412      History   Chief Complaint Chief Complaint  Patient presents with   Cough    HPI Sandra Mahoney is a 47 y.o. female.    Cough Here for cough and nasal congestion that has been going on for about 7 days.  She has not had any fever.  About 4 days ago she started having some malaise and the cough has intensified.  She also started having some wheezing about 3 days ago.  She is having some increased nasal drainage and sinus pressure ulcer.  She has had bronchitis in the past and has used an inhaler in the past.  She is allergic to Percocet  Last menstrual cycle was October 19  History reviewed. No pertinent past medical history.  There are no problems to display for this patient.   Past Surgical History:  Procedure Laterality Date   CHOLECYSTECTOMY      OB History   No obstetric history on file.      Home Medications    Prior to Admission medications   Medication Sig Start Date End Date Taking? Authorizing Provider  albuterol (VENTOLIN HFA) 108 (90 Base) MCG/ACT inhaler Inhale 2 puffs into the lungs every 4 (four) hours as needed for wheezing or shortness of breath. 07/21/23  Yes Zenia Resides, MD  benzonatate (TESSALON) 100 MG capsule Take 1 capsule (100 mg total) by mouth 3 (three) times daily as needed for cough. 07/21/23  Yes Zenia Resides, MD  doxycycline (VIBRAMYCIN) 100 MG capsule Take 1 capsule (100 mg total) by mouth 2 (two) times daily for 7 days. 07/21/23 07/28/23 Yes Zenia Resides, MD  gabapentin (NEURONTIN) 300 MG capsule Take 300 mg by mouth at bedtime.   Yes [provider]  predniSONE (DELTASONE) 20 MG tablet Take 2 tablets (40 mg total) by mouth daily with breakfast for 5 days. 07/21/23 07/26/23 Yes Zenia Resides, MD  buPROPion [facility] SR) 150 MG 12 hr tablet Take 150 mg by mouth 2 (two) times daily.    [provider]     Family History History reviewed. No pertinent family history.  Social History Social History   Tobacco Use   Smoking status: Every Day    Types: Cigarettes   Smokeless tobacco: Never  Vaping Use   Vaping status: Never Used  Substance Use Topics   Alcohol use: Yes   Drug use: Never     Allergies   Percocet [oxycodone-acetaminophen]   Review of Systems Review of Systems  Respiratory:  Positive for cough.      Physical Exam Triage Vital Signs ED Triage Vitals  Encounter Vitals Group     BP 07/21/23 1430 (!) 130/92     Systolic BP Percentile --      Diastolic BP Percentile --      Pulse Rate 07/21/23 1430 80     Resp 07/21/23 1430 16     Temp 07/21/23 1430 98.6 F (37 C)     Temp Source 07/21/23 1430 Oral     SpO2 07/21/23 1430 94 %     Weight 07/21/23 1428 (!) 343 lb 0.6 oz (155.6 kg)     Height 07/21/23 1428 5\' 5"  (1.651 m)     Head Circumference --      Peak Flow --      Pain Score 07/21/23 1428 0     Pain Loc --  Pain Education --      Exclude from Growth Chart --    No data found.  Updated Vital Signs BP (!) 130/92 (BP Location: Left Arm)   Pulse 80   Temp 98.6 F (37 C) (Oral)   Resp 16   Ht 5\' 5"  (1.651 m)   Wt (!) 155.6 kg   LMP 07/14/2023 (Approximate)   SpO2 94%   BMI 57.08 kg/m   Visual Acuity Right Eye Distance:   Left Eye Distance:   Bilateral Distance:    Right Eye Near:   Left Eye Near:    Bilateral Near:     Physical Exam Vitals reviewed.  Constitutional:      General: She is not in acute distress.    Appearance: She is not ill-appearing, toxic-appearing or diaphoretic.  HENT:     Right Ear: Tympanic membrane and ear canal normal.     Left Ear: Tympanic membrane and ear canal normal.     Nose: Nose normal.     Mouth/Throat:     Mouth: Mucous membranes are moist.     Pharynx: No oropharyngeal exudate or posterior oropharyngeal erythema.  Eyes:     Extraocular Movements: Extraocular movements intact.      Conjunctiva/sclera: Conjunctivae normal.     Pupils: Pupils are equal, round, and reactive to light.  Cardiovascular:     Rate and Rhythm: Normal rate and regular rhythm.     Heart sounds: No murmur heard. Pulmonary:     Comments: There are bilateral expiratory wheezes heard with good air movement.  No rales or rhonchi  Musculoskeletal:     Cervical back: Neck supple.  Lymphadenopathy:     Cervical: No cervical adenopathy.  Skin:    Capillary Refill: Capillary refill takes less than 2 seconds.     Coloration: Skin is not jaundiced or pale.  Neurological:     General: No focal deficit present.     Mental Status: She is alert and oriented to person, place, and time.  Psychiatric:        Behavior: Behavior normal.      UC Treatments / Results  Labs (all labs ordered are listed, but only abnormal results are displayed) Labs Reviewed - No data to display  EKG   Radiology No results found.  Procedures Procedures (including critical care time)  Medications Ordered in UC Medications - No data to display  Initial Impression / Assessment and Plan / UC Course  I have reviewed the triage vital signs and the nursing notes.  Pertinent labs & imaging results that were available during my care of the patient were reviewed by me and considered in my medical decision making (see chart for details).        Albuterol and prednisone are sent in for asthma exacerbation.  Doxycycline is sent in for probable sinusitis and Tessalon Perles are sent in for the cough  No swabs are done as she is past the time when diagnosing COVID or flu would be helpful Final Clinical Impressions(s) / UC Diagnoses   Final diagnoses:  Mild intermittent asthma with exacerbation  Acute sinusitis, recurrence not specified, unspecified location     Discharge Instructions      Albuterol inhaler--do 2 puffs every 4 hours as needed for shortness of breath or wheezing  Take prednisone 20 mg--2 daily  for 5 days; this is for inflammation in your lungs  Take doxycycline 100 mg --1 capsule 2 times daily for 7 days; this is the antibiotic  Take benzonatate 100 mg, 1 tab every 8 hours as needed for cough.      ED Prescriptions     Medication Sig Dispense Auth. Provider   albuterol (VENTOLIN HFA) 108 (90 Base) MCG/ACT inhaler Inhale 2 puffs into the lungs every 4 (four) hours as needed for wheezing or shortness of breath. 1 each Zenia Resides, MD   predniSONE (DELTASONE) 20 MG tablet Take 2 tablets (40 mg total) by mouth daily with breakfast for 5 days. 10 tablet Zenia Resides, MD   benzonatate (TESSALON) 100 MG capsule Take 1 capsule (100 mg total) by mouth 3 (three) times daily as needed for cough. 21 capsule Zenia Resides, MD   doxycycline (VIBRAMYCIN) 100 MG capsule Take 1 capsule (100 mg total) by mouth 2 (two) times daily for 7 days. 14 capsule Marlinda Mike, Janace Aris, MD      PDMP not reviewed this encounter.   Zenia Resides, MD 07/21/23 [phone]
